# Patient Record
Sex: Female | Born: 1950 | Race: White | Hispanic: No | Smoking: Never smoker
Health system: Southern US, Community
[De-identification: ages and names within clinical notes are randomized; demographics above are authoritative.]

## PROBLEM LIST (undated history)

## (undated) DIAGNOSIS — R32 Unspecified urinary incontinence: Secondary | ICD-10-CM

## (undated) DIAGNOSIS — C801 Malignant (primary) neoplasm, unspecified: Secondary | ICD-10-CM

## (undated) DIAGNOSIS — J309 Allergic rhinitis, unspecified: Secondary | ICD-10-CM

## (undated) DIAGNOSIS — B019 Varicella without complication: Secondary | ICD-10-CM

## (undated) DIAGNOSIS — M1990 Unspecified osteoarthritis, unspecified site: Secondary | ICD-10-CM

## (undated) DIAGNOSIS — E039 Hypothyroidism, unspecified: Secondary | ICD-10-CM

## (undated) DIAGNOSIS — A630 Anogenital (venereal) warts: Secondary | ICD-10-CM

## (undated) HISTORY — PX: LAPAROSCOPY: SHX197

## (undated) HISTORY — DX: Unspecified osteoarthritis, unspecified site: M19.90

## (undated) HISTORY — PX: APPENDECTOMY: SHX54

## (undated) HISTORY — DX: Unspecified urinary incontinence: R32

## (undated) HISTORY — PX: WISDOM TOOTH EXTRACTION: SHX21

## (undated) HISTORY — DX: Allergic rhinitis, unspecified: J30.9

## (undated) HISTORY — PX: TONSILLECTOMY: SUR1361

## (undated) HISTORY — DX: Anogenital (venereal) warts: A63.0

## (undated) HISTORY — PX: BASAL CELL CARCINOMA EXCISION: SHX1214

## (undated) HISTORY — DX: Varicella without complication: B01.9

## (undated) HISTORY — PX: DENTAL SURGERY: SHX609

## (undated) HISTORY — DX: Hypothyroidism, unspecified: E03.9

---

## 2015-10-13 ENCOUNTER — Ambulatory Visit (INDEPENDENT_AMBULATORY_CARE_PROVIDER_SITE_OTHER): Payer: No Typology Code available for payment source | Admitting: Family Medicine

## 2015-10-13 ENCOUNTER — Encounter: Payer: Self-pay | Admitting: Family Medicine

## 2015-10-13 VITALS — BP 112/68 | HR 86 | Temp 98.1°F | Ht 66.5 in | Wt 193.6 lb

## 2015-10-13 DIAGNOSIS — R5383 Other fatigue: Secondary | ICD-10-CM | POA: Diagnosis not present

## 2015-10-13 LAB — CBC WITH DIFFERENTIAL/PLATELET
BASOS PCT: 0 %
Basophils Absolute: 0 cells/uL (ref 0–200)
EOS ABS: 136 {cells}/uL (ref 15–500)
EOS PCT: 2 %
HCT: 43.4 % (ref 35.0–45.0)
Hemoglobin: 14.4 g/dL (ref 11.7–15.5)
LYMPHS PCT: 29 %
Lymphs Abs: 1972 cells/uL (ref 850–3900)
MCH: 29.4 pg (ref 27.0–33.0)
MCHC: 33.2 g/dL (ref 32.0–36.0)
MCV: 88.8 fL (ref 80.0–100.0)
MONOS PCT: 9 %
MPV: 9.6 fL (ref 7.5–12.5)
Monocytes Absolute: 612 cells/uL (ref 200–950)
NEUTROS ABS: 4080 {cells}/uL (ref 1500–7800)
Neutrophils Relative %: 60 %
PLATELETS: 345 10*3/uL (ref 140–400)
RBC: 4.89 MIL/uL (ref 3.80–5.10)
RDW: 13.6 % (ref 11.0–15.0)
WBC: 6.8 10*3/uL (ref 3.8–10.8)

## 2015-10-13 LAB — CORTISOL: CORTISOL PLASMA: 9.7 ug/dL

## 2015-10-13 LAB — TSH: TSH: 0.09 mIU/L — ABNORMAL LOW

## 2015-10-13 NOTE — Assessment & Plan Note (Signed)
Patient reports symptoms of feeling crummy over the last week. Only specific complaints other than this are loose stools and some sinus pressure. Does note some sadness though no specific depression. She has a benign exam today. Vital signs are stable. We'll check lab work as outlined below to workup further. If lab work unremarkable with suspect viral illness leading to loose stools and fatigue. She'll continue to monitor through the weekend and next week if not improving will let us know. Given return precautions.

## 2015-10-13 NOTE — Progress Notes (Signed)
Pre visit review using our clinic review tool, if applicable. No additional management support is needed unless otherwise documented below in the visit note. 

## 2015-10-13 NOTE — Progress Notes (Signed)
Patient ID: Ashley Andrews, female   DOB: 1950/04/25, 65 y.o.   MRN: [id number]  Tommi Rumps, MD Phone: [phone]  Ashley Andrews is a 65 y.o. female who presents today for new patient visit.  Patient notes over the last week she has felt crummy. Started on Monday. Has had some loose stools with this. Some coldness and clamminess. Some mild stomach cramping though no abdominal pain. No fevers. No urinary issues. No chest pain. No shortness of breath. No blood in her stool. No nausea or vomiting. She's been eating a bland diet. She does note some sinus pressure and congestion though this has improved with saline irrigation. She has a history of hypothyroidism. Currently taking nature thyroid. Additionally taking DHEA for adrenal insufficiency. She does note some sadness related to her late husband's death about 6 months ago. She notes continued intermittent sadness with this. Occasionally in the past she had felt as though she be better off not being around though no intent or plan to harm herself. Has not had these thoughts recently. He notes an occasional night sweat that she chalked up to using a heavy blanket during the summer. Has improved with switching to her summer bed cover.  Active Ambulatory Problems    Diagnosis Date Noted  . Other fatigue 10/13/2015   Resolved Ambulatory Problems    Diagnosis Date Noted  . No Resolved Ambulatory Problems   Past Medical History  Diagnosis Date  . Arthritis   . Chickenpox   . Genital warts   . Allergic rhinitis   . Urine incontinence   . Hypothyroidism     Family History  Problem Relation Age of Onset  . Lung cancer Maternal Grandfather   . Bone cancer Maternal Grandfather   . Breast cancer Father   . Colon cancer Paternal Uncle   . Skin cancer Other   . Heart attack Maternal Uncle   . Hypertension Mother   . Hyperlipidemia Mother   . Stroke Paternal Grandfather   . Transient ischemic attack Mother   . Transient ischemic attack Maternal  Aunt   . ADD / ADHD    . Arthritis    . Cataracts    . Arthritis/Rheumatoid Maternal Grandfather   . Dementia    . Diabetes    . Hiatal hernia    . Tuberculosis    . Migraines      Social History   Social History  . Marital Status: Widowed    Spouse Name: N/A  . Number of Children: N/A  . Years of Education: N/A   Occupational History  . Not on file.   Social History Main Topics  . Smoking status: Never Smoker   . Smokeless tobacco: Not on file  . Alcohol Use: 2.4 oz/week    4 Standard drinks or equivalent per week  . Drug Use: No  . Sexual Activity: Not on file   Other Topics Concern  . Not on file   Social History Narrative  . No narrative on file    ROS  General:  Negative for nexplained weight loss, fever Skin: Negative for new or changing mole, sore that won't heal HEENT: Negative for trouble hearing, trouble seeing, ringing in ears, mouth sores, hoarseness, change in voice, dysphagia. CV:  Negative for chest pain, dyspnea, edema, palpitations Resp: Negative for cough, dyspnea, hemoptysis GI: Negative for nausea, vomiting, diarrhea, constipation, abdominal pain, melena, hematochezia. GU: Positive for minor incontinence, Negative for dysuria, urinary hesitance, hematuria, vaginal or penile discharge, polyuria, sexual difficulty,  lumps in testicle or breasts MSK: Negative for muscle cramps or aches, joint pain or swelling Neuro: Negative for headaches, weakness, numbness, dizziness, passing out/fainting Psych: Positive for sadness, stress, Negative for anxiety, memory problems  Objective  Physical Exam Filed Vitals:   10/13/15 1524  BP: 112/68  Pulse: 86  Temp: 98.1 F (36.7 C)    BP Readings from Last 3 Encounters:  10/13/15 112/68   Wt Readings from Last 3 Encounters:  10/13/15 193 lb 9.6 oz (87.816 kg)    Physical Exam  Constitutional: She is well-developed, well-nourished, and in no distress.  HENT:  Head: Normocephalic and atraumatic.    Right Ear: External ear normal.  Left Ear: External ear normal.  Mouth/Throat: Oropharynx is clear and moist. No oropharyngeal exudate.  Initially bilateral ear canals impacted with cerumen, irrigated by CMA revealing normal TMs bilaterally  Eyes: Conjunctivae are normal. Pupils are equal, round, and reactive to light.  Neck: Neck supple.  Cardiovascular: Normal rate, regular rhythm and normal heart sounds.   Pulmonary/Chest: Effort normal and breath sounds normal.  Abdominal: Soft. Bowel sounds are normal. She exhibits no distension. There is no tenderness. There is no rebound and no guarding.  Musculoskeletal: She exhibits no edema.  Lymphadenopathy:    She has no cervical adenopathy.  Neurological: She is alert. Gait normal.  Skin: Skin is warm and dry. She is not diaphoretic.  Psychiatric: Mood and affect normal.     Assessment/Plan:   Other fatigue Patient reports symptoms of feeling crummy over the last week. Only specific complaints other than this are loose stools and some sinus pressure. Does note some sadness though no specific depression. She has a benign exam today. Vital signs are stable. We'll check lab work as outlined below to workup further. If lab work unremarkable with suspect viral illness leading to loose stools and fatigue. She'll continue to monitor through the weekend and next week if not improving will let us know. Given return precautions.    Orders Placed This Encounter  Procedures  . CBC w/Diff  . Comp Met (CMET)  . Cortisol  . Sed Rate (ESR)  . TSH    No orders of the defined types were placed in this encounter.     Tommi Rumps, MD [place]

## 2015-10-13 NOTE — Patient Instructions (Signed)
Nice to see you. We are going to check some lab work to ensure that there is nothing additional going on with your fatigue. This could be related to a viral illness given your loose stools. If you develop abdominal pain, blood in your stool, fevers, thoughts of harming herself or others, or any new or change in symptoms please seek medical attention.

## 2015-10-14 LAB — COMPREHENSIVE METABOLIC PANEL
ALK PHOS: 60 U/L (ref 33–130)
ALT: 16 U/L (ref 6–29)
AST: 17 U/L (ref 10–35)
Albumin: 4.2 g/dL (ref 3.6–5.1)
BUN: 14 mg/dL (ref 7–25)
CO2: 24 mmol/L (ref 20–31)
CREATININE: 0.66 mg/dL (ref 0.50–0.99)
Calcium: 9.4 mg/dL (ref 8.6–10.4)
Chloride: 101 mmol/L (ref 98–110)
GLUCOSE: 128 mg/dL — AB (ref 65–99)
Potassium: 4.5 mmol/L (ref 3.5–5.3)
SODIUM: 136 mmol/L (ref 135–146)
TOTAL PROTEIN: 6.7 g/dL (ref 6.1–8.1)
Total Bilirubin: 0.4 mg/dL (ref 0.2–1.2)

## 2015-10-14 LAB — SEDIMENTATION RATE: Sed Rate: 3 mm/hr (ref 0–30)

## 2015-10-17 ENCOUNTER — Telehealth: Payer: Self-pay | Admitting: Family Medicine

## 2015-10-17 ENCOUNTER — Other Ambulatory Visit: Payer: Self-pay | Admitting: Surgical

## 2015-10-17 NOTE — Telephone Encounter (Signed)
Pt called and stated the Osteopath doctor in [place] did not received the fax.   Call pt @ [phone]. Thank you!

## 2015-10-18 ENCOUNTER — Other Ambulatory Visit: Payer: Self-pay | Admitting: Surgical

## 2015-10-18 NOTE — Addendum Note (Signed)
Addended by: Durwin Glaze on: 10/18/2015 03:09 PM   Modules accepted: Medications

## 2015-10-18 NOTE — Telephone Encounter (Signed)
Spoke with patient and she gave me the fax number to office.

## 2015-10-20 ENCOUNTER — Telehealth: Payer: Self-pay | Admitting: Family Medicine

## 2015-10-20 NOTE — Telephone Encounter (Signed)
The patient was told to call back if she did not feel any better from her previous appointment with Dr. Caryl Bis on 6.23.17. She stated she is not feeling any better. Would like a call back from [facility].

## 2015-10-20 NOTE — Telephone Encounter (Signed)
Notified patient of message and she will call back next week if not better to make appointment

## 2015-10-20 NOTE — Telephone Encounter (Signed)
I would advise monitoring through the weekend if she has continued to feel improved and if not continuing to improve would suggest follow-up.

## 2015-10-20 NOTE — Telephone Encounter (Signed)
Spoke with patient and she stated that she is feeling a little better from the other day. She is eating a bigger verity of foods. She is still feeling fatigue not as bad as the other day, but still not better completely. I explained that it sometimes takes a while for the body to get a hundred percent after being sick. Does she need to come back in or continue to wait?

## 2015-11-06 ENCOUNTER — Telehealth: Payer: Self-pay | Admitting: *Deleted

## 2015-11-06 NOTE — Telephone Encounter (Signed)
We have not received this.

## 2015-11-06 NOTE — Telephone Encounter (Signed)
See below

## 2015-11-06 NOTE — Telephone Encounter (Signed)
FYI Patient stated that [facility] drew her A1c for another provider. She had the results faxed over to the office on today. Pt contact [phone]

## 2015-11-06 NOTE — Telephone Encounter (Signed)
Please advise if this was received in his mailbox, thanks

## 2015-11-08 NOTE — Telephone Encounter (Signed)
Notified patient that labs came in today

## 2015-11-08 NOTE — Telephone Encounter (Signed)
Patient called to see if you have received a fax with her A1C on it from Dr. Charm Rings phone # [phone].

## 2015-11-09 ENCOUNTER — Ambulatory Visit (INDEPENDENT_AMBULATORY_CARE_PROVIDER_SITE_OTHER): Payer: No Typology Code available for payment source | Admitting: Family Medicine

## 2015-11-09 ENCOUNTER — Encounter: Payer: Self-pay | Admitting: Family Medicine

## 2015-11-09 VITALS — BP 110/72 | HR 90 | Temp 98.5°F | Ht 66.5 in | Wt 194.0 lb

## 2015-11-09 DIAGNOSIS — R7303 Prediabetes: Secondary | ICD-10-CM

## 2015-11-09 DIAGNOSIS — R600 Localized edema: Secondary | ICD-10-CM | POA: Insufficient documentation

## 2015-11-09 DIAGNOSIS — R5383 Other fatigue: Secondary | ICD-10-CM | POA: Diagnosis not present

## 2015-11-09 DIAGNOSIS — E039 Hypothyroidism, unspecified: Secondary | ICD-10-CM

## 2015-11-09 NOTE — Patient Instructions (Signed)
Nice to see you. I am glad you're fatigue is better. Please continue exercise elevation of her legs to help with the swelling in her feet. Please watch your salt intake. Please discuss your thyroid with Dr. Johnathan Hausen.

## 2015-11-09 NOTE — Assessment & Plan Note (Signed)
Suspect venous insufficiency as cause versus related to her thyroid dysfunction. No CHF symptoms. Other recent lab work reassuring. Advised that she needs to follow-up with her doctor in [place] regarding her thyroid as she continues to follow with them for this issue. She sees him next week. She'll continue to exercise and elevate her legs. Could consider compression stockings in the future.

## 2015-11-09 NOTE — Assessment & Plan Note (Signed)
Resolved. Suspect viral illness. She'll continue to monitor for recurrence.

## 2015-11-09 NOTE — Assessment & Plan Note (Signed)
She will continue with diet and exercise.

## 2015-11-09 NOTE — Assessment & Plan Note (Signed)
TSH is low. Overtreated at this time. She is on Armour thyroid and a host of other supplements for this. She wants to continue following with her doctor in [place] regarding this. Advised that she needs to discuss this with them when she sees her next week.

## 2015-11-09 NOTE — Progress Notes (Signed)
Pre visit review using our clinic review tool, if applicable. No additional management support is needed unless otherwise documented below in the visit note. 

## 2015-11-09 NOTE — Progress Notes (Signed)
  Tommi Rumps, MD Phone: [phone]  Ashley Andrews is a 65 y.o. female who presents today for follow-up.  Fatigue: Patient notes this is resolved.  Bilateral ankle swelling: Patient notes has had this since moving to [place]. Notes it was bad last week while she was at the beach. Thinks this is related to mostly eating in the cafeteria where she lives. Left minimally greater than right swelling. Note she has started walking 30 minutes a day and it is improved. Also elevating her legs. No orthopnea or PND. Does have a history of hypothyroidism and recent TSH was 0.06. She [place] Dr. out of state for this who is a holistic M.D. and plans to see them next week to discuss her thyroid.  Prediabetes: A1c recently checked in 5.9. Just started exercise and has been watching her diet.  PMH: nonsmoker.   ROS see history of present illness  Objective  Physical Exam Filed Vitals:   11/09/15 0928  BP: 110/72  Pulse: 90  Temp: 98.5 F (36.9 C)    BP Readings from Last 3 Encounters:  11/09/15 110/72  10/13/15 112/68   Wt Readings from Last 3 Encounters:  11/09/15 194 lb (87.998 kg)  10/13/15 193 lb 9.6 oz (87.816 kg)    Physical Exam  Constitutional: She is well-developed, well-nourished, and in no distress.  HENT:  Head: Normocephalic and atraumatic.  Cardiovascular: Normal rate, regular rhythm and normal heart sounds.   Trace edema bilaterally  Pulmonary/Chest: Effort normal and breath sounds normal.  Neurological: She is alert. Gait normal.  Skin: Skin is warm and dry.     Assessment/Plan: Please see individual problem list.  Other fatigue Resolved. Suspect viral illness. She'll continue to monitor for recurrence.  Bilateral lower extremity edema Suspect venous insufficiency as cause versus related to her thyroid dysfunction. No CHF symptoms. Other recent lab work reassuring. Advised that she needs to follow-up with her doctor in [place] regarding her  thyroid as she continues to follow with them for this issue. She sees him next week. She'll continue to exercise and elevate her legs. Could consider compression stockings in the future.  Prediabetes She will continue with diet and exercise.  Hypothyroidism TSH is low. Overtreated at this time. She is on Armour thyroid and a host of other supplements for this. She wants to continue following with her doctor in [place] regarding this. Advised that she needs to discuss this with them when she sees her next week.    No orders of the defined types were placed in this encounter.    No orders of the defined types were placed in this encounter.    Tommi Rumps, MD [place]

## 2015-11-28 ENCOUNTER — Encounter: Payer: Self-pay | Admitting: Family Medicine

## 2016-02-09 ENCOUNTER — Telehealth: Payer: Self-pay | Admitting: *Deleted

## 2016-02-09 DIAGNOSIS — Z13820 Encounter for screening for osteoporosis: Secondary | ICD-10-CM

## 2016-02-09 DIAGNOSIS — Z1239 Encounter for other screening for malignant neoplasm of breast: Secondary | ICD-10-CM

## 2016-02-09 DIAGNOSIS — Z124 Encounter for screening for malignant neoplasm of cervix: Secondary | ICD-10-CM

## 2016-02-09 DIAGNOSIS — Z85828 Personal history of other malignant neoplasm of skin: Secondary | ICD-10-CM

## 2016-02-09 DIAGNOSIS — Z1211 Encounter for screening for malignant neoplasm of colon: Secondary | ICD-10-CM

## 2016-02-09 NOTE — Telephone Encounter (Signed)
Spoke with patient states she would like to be referred to OB/Gyn for annual pap smear.  Also needs referral for Mammogram /DEXA .    Patient states she needs referral for dermatology for history of basal cell carcinoma .  She is new to area not familiar with local doctors  previously lived in [place].   Also needs referral for colonoscopy last done 11/30/2008.  Patient is scheduled for physical in 12/17.

## 2016-02-09 NOTE — Telephone Encounter (Signed)
Orders placed.

## 2016-02-09 NOTE — Telephone Encounter (Signed)
Patient requested to have orders place to see a OB GYN(HRT) and dermatologist. Pt contact [phone]

## 2016-02-12 NOTE — Telephone Encounter (Signed)
Patient advised she should be contacting in regards to referrals.

## 2016-02-26 ENCOUNTER — Encounter: Payer: Self-pay | Admitting: Family Medicine

## 2016-03-25 ENCOUNTER — Encounter: Payer: Self-pay | Admitting: Family Medicine

## 2016-03-25 ENCOUNTER — Ambulatory Visit (INDEPENDENT_AMBULATORY_CARE_PROVIDER_SITE_OTHER): Payer: No Typology Code available for payment source | Admitting: Family Medicine

## 2016-03-25 ENCOUNTER — Telehealth: Payer: Self-pay | Admitting: Family Medicine

## 2016-03-25 VITALS — BP 106/66 | HR 98 | Temp 98.7°F | Wt 199.4 lb

## 2016-03-25 DIAGNOSIS — R5383 Other fatigue: Secondary | ICD-10-CM

## 2016-03-25 LAB — COMPREHENSIVE METABOLIC PANEL
ALT: 13 U/L (ref 0–35)
AST: 14 U/L (ref 0–37)
Albumin: 4.2 g/dL (ref 3.5–5.2)
Alkaline Phosphatase: 68 U/L (ref 39–117)
BILIRUBIN TOTAL: 0.6 mg/dL (ref 0.2–1.2)
BUN: 16 mg/dL (ref 6–23)
CALCIUM: 9.5 mg/dL (ref 8.4–10.5)
CO2: 25 meq/L (ref 19–32)
CREATININE: 0.55 mg/dL (ref 0.40–1.20)
Chloride: 101 mEq/L (ref 96–112)
GFR: 118.19 mL/min (ref >60.00)
GLUCOSE: 132 mg/dL — AB (ref 70–99)
Potassium: 4 mEq/L (ref 3.5–5.1)
SODIUM: 137 meq/L (ref 135–145)
Total Protein: 6.8 g/dL (ref 6.0–8.3)

## 2016-03-25 LAB — TSH: TSH: 0.08 u[IU]/mL — AB (ref 0.35–4.50)

## 2016-03-25 LAB — CORTISOL: CORTISOL PLASMA: 6.6 ug/dL

## 2016-03-25 LAB — CBC
HCT: 41.5 % (ref 36.0–46.0)
Hemoglobin: 14.1 g/dL (ref 12.0–15.0)
MCHC: 33.9 g/dL (ref 30.0–36.0)
MCV: 87.7 fl (ref 78.0–100.0)
Platelets: 287 10*3/uL (ref 150.0–400.0)
RBC: 4.73 Mil/uL (ref 3.87–5.11)
RDW: 13.3 % (ref 11.5–15.5)
WBC: 11.4 10*3/uL — ABNORMAL HIGH (ref 4.0–10.5)

## 2016-03-25 LAB — VITAMIN D 25 HYDROXY (VIT D DEFICIENCY, FRACTURES): VITD: 31.36 ng/mL (ref 30.00–100.00)

## 2016-03-25 LAB — VITAMIN B12

## 2016-03-25 NOTE — Patient Instructions (Signed)
Nice to see you. We will check lab work and call you with the results.  If you develop worsening or new symptoms please seek medical attention.

## 2016-03-25 NOTE — Telephone Encounter (Signed)
Spoke with patient and advised that she can take medications. She can also eat. If we need fasting labs we can schedule lab appointment for later date.

## 2016-03-25 NOTE — Telephone Encounter (Signed)
Pt called wanting to know if she's gonna have labs done today? Pt has a appt at 1:15pm And if she can eat and take her regular medication?  Call pt @ [phone]. Thank you!

## 2016-03-25 NOTE — Progress Notes (Signed)
Pre visit review using our clinic review tool, if applicable. No additional management support is needed unless otherwise documented below in the visit note. 

## 2016-03-26 NOTE — Assessment & Plan Note (Signed)
The patient has very vague symptoms. Has been improving over the last week or so. Could be related to grief or underlying thyroid dysfunction. No obvious cause on exam or history. We will obtain lab work as outlined below and then continue to monitor if no obvious cause on lab work. She wants to wait and get through the anniversary of her husband's death prior to working this up any further than lab work.

## 2016-03-26 NOTE — Progress Notes (Signed)
  Tommi Rumps, MD Phone: [phone]  Ashley Andrews is a 65 y.o. female who presents today for same-day visit.  Patient notes 1 week ago she was very tired and slept more than usual. She slept about 18-20 hours that day. Notes since then she has been more tired than usual though she is sleeping less and less progressively. Yesterday slept 1.5 hours extra compared to usual. She notes no other symptoms with this. No fevers, upper respiratory symptoms, body aches, chest pain, shortness of breath, sick contacts, apneic episodes, depression, anxiety, sweats, or chills she does note she is coming up on the anniversary of her husband's death and wonders if it may be some sort of grief reaction. She appears to have had cortisol issues in the past as well. Currently taking thyroid medication. She is taking in good food and liquids. No sick contacts to her knowledge. Notes it is not exertional fatigue or fatigue, though is a tiredness and a desire to sleep.  ROS see history of present illness  Objective  Physical Exam Vitals:   03/25/16 1317  BP: 106/66  Pulse: 98  Temp: 98.7 F (37.1 C)    BP Readings from Last 3 Encounters:  03/25/16 106/66  11/09/15 110/72  10/13/15 112/68   Wt Readings from Last 3 Encounters:  03/25/16 199 lb 6.4 oz (90.4 kg)  11/09/15 194 lb (88 kg)  10/13/15 193 lb 9.6 oz (87.8 kg)    Physical Exam  Constitutional: She is well-developed, well-nourished, and in no distress.  HENT:  Head: Normocephalic and atraumatic.  Mouth/Throat: Oropharynx is clear and moist. No oropharyngeal exudate.  Normal TMs bilaterally  Eyes: Conjunctivae are normal. Pupils are equal, round, and reactive to light.  Cardiovascular: Normal rate, regular rhythm and normal heart sounds.   Pulmonary/Chest: Effort normal and breath sounds normal.  Abdominal: Soft. Bowel sounds are normal. She exhibits no distension. There is no tenderness.  Musculoskeletal: She exhibits no edema.    Neurological: She is alert. Gait normal.  Skin: Skin is warm and dry.  Psychiatric: Mood and affect normal.     Assessment/Plan: Please see individual problem list.  Tiredness The patient has very vague symptoms. Has been improving over the last week or so. Could be related to grief or underlying thyroid dysfunction. No obvious cause on exam or history. We will obtain lab work as outlined below and then continue to monitor if no obvious cause on lab work. She wants to wait and get through the anniversary of her husband's death prior to working this up any further than lab work.   Orders Placed This Encounter  Procedures  . Comp Met (CMET)  . CBC  . TSH  . B12  . Vitamin D (25 hydroxy)  . Cortisol    Tommi Rumps, MD [place]

## 2016-03-28 ENCOUNTER — Ambulatory Visit: Payer: No Typology Code available for payment source

## 2016-04-01 ENCOUNTER — Encounter: Payer: No Typology Code available for payment source | Admitting: Family Medicine

## 2016-04-05 ENCOUNTER — Encounter: Payer: No Typology Code available for payment source | Admitting: Family Medicine

## 2016-04-08 ENCOUNTER — Encounter: Payer: No Typology Code available for payment source | Admitting: Family Medicine

## 2016-04-09 LAB — HM DIABETES EYE EXAM

## 2016-04-10 ENCOUNTER — Encounter: Payer: Self-pay | Admitting: Family Medicine

## 2016-04-25 ENCOUNTER — Ambulatory Visit
Admission: RE | Admit: 2016-04-25 | Discharge: 2016-04-25 | Disposition: A | Discharge status: Home / Self Care | Payer: No Typology Code available for payment source | Source: Ambulatory Visit | Attending: Family Medicine | Admitting: Family Medicine

## 2016-04-25 DIAGNOSIS — Z1239 Encounter for other screening for malignant neoplasm of breast: Secondary | ICD-10-CM

## 2016-04-25 DIAGNOSIS — Z13820 Encounter for screening for osteoporosis: Secondary | ICD-10-CM

## 2016-04-25 DIAGNOSIS — Z1231 Encounter for screening mammogram for malignant neoplasm of breast: Secondary | ICD-10-CM | POA: Diagnosis present

## 2016-04-25 HISTORY — DX: Malignant (primary) neoplasm, unspecified: C80.1

## 2016-05-03 ENCOUNTER — Inpatient Hospital Stay
Admission: RE | Admit: 2016-05-03 | Discharge: 2016-05-03 | Disposition: A | Discharge status: Home / Self Care | Payer: Self-pay | Source: Ambulatory Visit | Attending: *Deleted | Admitting: *Deleted

## 2016-05-03 ENCOUNTER — Other Ambulatory Visit: Payer: Self-pay | Admitting: *Deleted

## 2016-05-03 DIAGNOSIS — Z9289 Personal history of other medical treatment: Secondary | ICD-10-CM

## 2016-05-10 ENCOUNTER — Telehealth: Payer: Self-pay | Admitting: *Deleted

## 2016-05-10 ENCOUNTER — Ambulatory Visit (INDEPENDENT_AMBULATORY_CARE_PROVIDER_SITE_OTHER): Payer: No Typology Code available for payment source | Admitting: Family Medicine

## 2016-05-10 ENCOUNTER — Encounter: Payer: Self-pay | Admitting: Family Medicine

## 2016-05-10 VITALS — BP 112/80 | HR 96 | Temp 98.2°F | Ht 66.5 in | Wt 203.0 lb

## 2016-05-10 DIAGNOSIS — R002 Palpitations: Secondary | ICD-10-CM | POA: Insufficient documentation

## 2016-05-10 DIAGNOSIS — R7309 Other abnormal glucose: Secondary | ICD-10-CM | POA: Diagnosis not present

## 2016-05-10 DIAGNOSIS — N3941 Urge incontinence: Secondary | ICD-10-CM | POA: Diagnosis not present

## 2016-05-10 DIAGNOSIS — R3915 Urgency of urination: Secondary | ICD-10-CM | POA: Diagnosis not present

## 2016-05-10 DIAGNOSIS — Z0001 Encounter for general adult medical examination with abnormal findings: Secondary | ICD-10-CM | POA: Diagnosis not present

## 2016-05-10 DIAGNOSIS — D72829 Elevated white blood cell count, unspecified: Secondary | ICD-10-CM | POA: Insufficient documentation

## 2016-05-10 LAB — POCT URINALYSIS DIPSTICK
Bilirubin, UA: NEGATIVE
Blood, UA: NEGATIVE
Glucose, UA: NEGATIVE
Ketones, UA: NEGATIVE
NITRITE UA: NEGATIVE
PH UA: 6.5
PROTEIN UA: NEGATIVE
Spec Grav, UA: 1.01
UROBILINOGEN UA: 0.2

## 2016-05-10 NOTE — Assessment & Plan Note (Signed)
Physical exam was completed today. Patient deferred pelvic exam and breast exam to gynecology. Health maintenance brought up-to-date. Advised on diet and exercise. Lab work as outlined below. Did have recent lab work in December as well.

## 2016-05-10 NOTE — Telephone Encounter (Signed)
Pt will bring in her resulted labs for A1c and CBC that she has drawn elsewhere, she will only need orders for a lipid panel. Patient requested to have her mammogram results sent over to [facility]

## 2016-05-10 NOTE — Progress Notes (Signed)
Tommi Rumps, MD Phone: [phone]  Ashley Andrews is a 66 y.o. female who presents today for physical exam.  Patient got away from exercising in December. She is starting to get back and riding a bike. States her diet could be better. Does get some protein in morning with breakfast. Tries to eat vegetables. Does eat a fair amount of dessert. Tetanus vaccination, flu vaccination, and Zostavax up-to-date. Hepatitis C and HIV screening up-to-date. She is going to see gynecology for Pap smear and breast exam. Mammogram up-to-date. Referral is already placed for colonoscopy. No tobacco use. Occasional alcohol use. No illicit drug use.  Palpitations: Patient notes these occur once in a while. They're much less frequent now as she has been lowering her thyroid dose through her physician in [place]. Palpitations typically lasts very briefly now. They used to last longer. Last EKG was 2-3 years ago and she reports was normal. She notes no chest pain or shortness of breath with palpitations. She does note some sadness about her husband dying over a year ago though no depression. No SI or HI.  Urgent incontinence: Patient notes she gets an urgency and just can't quite make it to the bathroom. No stress incontinence. No burning with urination. Occasionally wears a pad for this.   Active Ambulatory Problems    Diagnosis Date Noted  . Tiredness 10/13/2015  . Bilateral lower extremity edema 11/09/2015  . Prediabetes 11/09/2015  . Hypothyroidism 11/09/2015  . Encounter for general adult medical examination with abnormal findings 05/10/2016  . Palpitations 05/10/2016  . Urge incontinence 05/10/2016  . Urinary urgency 05/10/2016  . Leukocytosis 05/10/2016   Resolved Ambulatory Problems    Diagnosis Date Noted  . No Resolved Ambulatory Problems   Past Medical History:  Diagnosis Date  . Allergic rhinitis   . Arthritis   . Cancer ([place])   . Chickenpox   . Genital warts   . Hypothyroidism     . Urine incontinence     Family History  Problem Relation Age of Onset  . Lung cancer Maternal Grandfather   . Bone cancer Maternal Grandfather   . Arthritis/Rheumatoid Maternal Grandfather   . Breast cancer Father   . Colon cancer Paternal Uncle   . Skin cancer Other   . Heart attack Maternal Uncle   . Hypertension Mother   . Hyperlipidemia Mother   . Transient ischemic attack Mother   . Stroke Paternal Grandfather   . Transient ischemic attack Maternal Aunt   . ADD / ADHD    . Arthritis    . Cataracts    . Dementia    . Diabetes    . Hiatal hernia    . Tuberculosis    . Migraines      Social History   Social History  . Marital status: Widowed    Spouse name: N/A  . Number of children: N/A  . Years of education: N/A   Occupational History  . Not on file.   Social History Main Topics  . Smoking status: Never Smoker  . Smokeless tobacco: Never Used  . Alcohol use 2.4 oz/week    4 Standard drinks or equivalent per week  . Drug use: No  . Sexual activity: Not on file   Other Topics Concern  . Not on file   Social History Narrative  . No narrative on file    ROS  General:  Negative for nexplained weight loss, fever Skin: Negative for new or changing mole, sore that won't heal HEENT:  Positive for ringing in ears, Negative for trouble hearing, trouble seeing, mouth sores, hoarseness, change in voice, dysphagia. CV:  Negative for chest pain, dyspnea, edema, positive for palpitations Resp: Negative for cough, dyspnea, hemoptysis GI: Negative for nausea, vomiting, diarrhea, constipation, abdominal pain, melena, hematochezia. GU: Positive for urge incontinence, Negative for dysuria, urinary hesitance, hematuria, vaginal or penile discharge, polyuria, sexual difficulty, lumps in testicle or breasts MSK: Negative for muscle cramps or aches, joint pain or swelling Neuro: Negative for headaches, weakness, numbness, dizziness, passing out/fainting Psych: Positive  for sadness, Negative for depression, anxiety, memory problems  Objective  Physical Exam Vitals:   05/10/16 1431  BP: 112/80  Pulse: 96  Temp: 98.2 F (36.8 C)    BP Readings from Last 3 Encounters:  05/10/16 112/80  03/25/16 106/66  11/09/15 110/72   Wt Readings from Last 3 Encounters:  05/10/16 203 lb (92.1 kg)  03/25/16 199 lb 6.4 oz (90.4 kg)  11/09/15 194 lb (88 kg)    Physical Exam  Constitutional: She is well-developed, well-nourished, and in no distress.  HENT:  Head: Normocephalic and atraumatic.  Mouth/Throat: Oropharynx is clear and moist.  Eyes: Conjunctivae are normal. Pupils are equal, round, and reactive to light.  Cardiovascular: Normal rate, regular rhythm and normal heart sounds.   Pulmonary/Chest: Effort normal and breath sounds normal.  Abdominal: Soft. Bowel sounds are normal. She exhibits no distension. There is no tenderness.  Musculoskeletal: She exhibits no edema.  Neurological: She is alert. Gait normal.  Skin: Skin is warm and dry.  Psychiatric: Mood and affect normal.   EKG: Normal sinus rhythm, rate 91, RSR prime in V1, no ST or T-wave changes  Assessment/Plan:   Encounter for general adult medical examination with abnormal findings Physical exam was completed today. Patient deferred pelvic exam and breast exam to gynecology. Health maintenance brought up-to-date. Advised on diet and exercise. Lab work as outlined below. Did have recent lab work in December as well.  Palpitations Suspect related to over treatment with thyroid medication given prior low TSH. These have significantly improved as her physician in [place] has slowly decreased her thyroid dose. EKG done today is reassuring. Discussed continuing to monitor and if not continuing to improve with decreasing dose of thyroid medication could consider further workup.  Urge incontinence Urinary urge incontinence. Patient notes this is not too much of an issue for her. We will defer  medication at this time. Discussed timed voiding though it does not bother her enough to do this. She'll continue to monitor. We'll check a UA today.  Leukocytosis Noted on prior lab work. Mild elevation. Discussed nonspecific. Plan to recheck. Patient is supposed to have fasting lab work done though she thinks she had a lipid panel done late last year. She will check on this and let us know. She will return for labs either way and will let us know so she can schedule an appointment for this.  Patient sadness is related to loss of her husband last year. No depression. Discussed continuing to monitor this.  Orders Placed This Encounter  Procedures  . CBC    Standing Status:   Future    Standing Expiration Date:   05/10/2017  . HgB A1c    Standing Status:   Future    Standing Expiration Date:   05/10/2017  . Lipid Profile    Standing Status:   Future    Standing Expiration Date:   05/10/2017  . POCT Urinalysis Dipstick  . EKG 12-Lead  Tommi Rumps, MD [place]

## 2016-05-10 NOTE — Telephone Encounter (Signed)
FYI on labs , [facility] will see this with epic

## 2016-05-10 NOTE — Addendum Note (Signed)
Addended by: Leeanne Rio on: 05/10/2016 04:14 PM   Modules accepted: Orders

## 2016-05-10 NOTE — Patient Instructions (Addendum)
Nice to see you. We'll get some fasting labs at your convenience and contact you with the results. Please work on diet and exercise. If you develop persistent palpitations please seek medical attention immediately.

## 2016-05-10 NOTE — Assessment & Plan Note (Signed)
Suspect related to over treatment with thyroid medication given prior low TSH. These have significantly improved as her physician in [place] has slowly decreased her thyroid dose. EKG done today is reassuring. Discussed continuing to monitor and if not continuing to improve with decreasing dose of thyroid medication could consider further workup.

## 2016-05-10 NOTE — Assessment & Plan Note (Signed)
Noted on prior lab work. Mild elevation. Discussed nonspecific. Plan to recheck. Patient is supposed to have fasting lab work done though she thinks she had a lipid panel done late last year. She will check on this and let us know. She will return for labs either way and will let us know so she can schedule an appointment for this.

## 2016-05-10 NOTE — Assessment & Plan Note (Signed)
Urinary urge incontinence. Patient notes this is not too much of an issue for her. We will defer medication at this time. Discussed timed voiding though it does not bother her enough to do this. She'll continue to monitor. We'll check a UA today.

## 2016-05-10 NOTE — Progress Notes (Signed)
Pre visit review using our clinic review tool, if applicable. No additional management support is needed unless otherwise documented below in the visit note. 

## 2016-05-11 LAB — URINALYSIS, MICROSCOPIC ONLY
BACTERIA UA: NONE SEEN [HPF]
Casts: NONE SEEN [LPF]
Crystals: NONE SEEN [HPF]
RBC / HPF: NONE SEEN RBC/HPF (ref <=2)
SQUAMOUS EPITHELIAL / LPF: NONE SEEN [HPF] (ref <=5)
WBC, UA: NONE SEEN WBC/HPF (ref <=5)
Yeast: NONE SEEN [HPF]

## 2016-05-12 LAB — URINE CULTURE: ORGANISM ID, BACTERIA: NO GROWTH

## 2016-05-13 ENCOUNTER — Ambulatory Visit: Payer: No Typology Code available for payment source | Admitting: Family Medicine

## 2016-05-13 NOTE — Telephone Encounter (Signed)
Patient notified and lab scheduled.

## 2016-05-13 NOTE — Telephone Encounter (Signed)
Left message to return call 

## 2016-05-13 NOTE — Telephone Encounter (Signed)
Noted. Please see if these are labs she had after our visit in December. If they are not we will still need to obtain the CBC as that is to recheck a slightly low white blood cell count she had at that time. Lipid panel already ordered. A1c will be canceled. Thanks.

## 2016-05-13 NOTE — Addendum Note (Signed)
Addended by: Leone Haven on: 05/13/2016 08:09 AM   Modules accepted: Orders

## 2016-05-16 ENCOUNTER — Other Ambulatory Visit (INDEPENDENT_AMBULATORY_CARE_PROVIDER_SITE_OTHER): Payer: No Typology Code available for payment source

## 2016-05-16 DIAGNOSIS — Z0001 Encounter for general adult medical examination with abnormal findings: Secondary | ICD-10-CM | POA: Diagnosis not present

## 2016-05-16 DIAGNOSIS — R002 Palpitations: Secondary | ICD-10-CM | POA: Diagnosis not present

## 2016-05-16 LAB — CBC
HEMATOCRIT: 41.7 % (ref 36.0–46.0)
Hemoglobin: 14.1 g/dL (ref 12.0–15.0)
MCHC: 33.7 g/dL (ref 30.0–36.0)
MCV: 87.8 fl (ref 78.0–100.0)
PLATELETS: 331 10*3/uL (ref 150.0–400.0)
RBC: 4.76 Mil/uL (ref 3.87–5.11)
RDW: 14.3 % (ref 11.5–15.5)
WBC: 5.6 10*3/uL (ref 4.0–10.5)

## 2016-05-16 LAB — LIPID PANEL
CHOL/HDL RATIO: 3
Cholesterol: 170 mg/dL (ref 0–200)
HDL: 63.2 mg/dL (ref >39.00)
LDL CALC: 91 mg/dL (ref 0–99)
NONHDL: 106.77
TRIGLYCERIDES: 78 mg/dL (ref 0.0–149.0)
VLDL: 15.6 mg/dL (ref 0.0–40.0)

## 2016-05-21 ENCOUNTER — Encounter: Payer: Self-pay | Admitting: Obstetrics and Gynecology

## 2016-05-21 ENCOUNTER — Ambulatory Visit (INDEPENDENT_AMBULATORY_CARE_PROVIDER_SITE_OTHER): Payer: No Typology Code available for payment source | Admitting: Obstetrics and Gynecology

## 2016-05-21 VITALS — BP 111/74 | HR 90 | Ht 66.5 in | Wt 200.2 lb

## 2016-05-21 DIAGNOSIS — E039 Hypothyroidism, unspecified: Secondary | ICD-10-CM

## 2016-05-21 DIAGNOSIS — E669 Obesity, unspecified: Secondary | ICD-10-CM

## 2016-05-21 DIAGNOSIS — Z01419 Encounter for gynecological examination (general) (routine) without abnormal findings: Secondary | ICD-10-CM | POA: Diagnosis not present

## 2016-05-21 DIAGNOSIS — N952 Postmenopausal atrophic vaginitis: Secondary | ICD-10-CM | POA: Diagnosis not present

## 2016-05-21 DIAGNOSIS — R3915 Urgency of urination: Secondary | ICD-10-CM

## 2016-05-21 DIAGNOSIS — N3941 Urge incontinence: Secondary | ICD-10-CM

## 2016-05-21 DIAGNOSIS — Z79890 Hormone replacement therapy (postmenopausal): Secondary | ICD-10-CM

## 2016-05-21 DIAGNOSIS — R7303 Prediabetes: Secondary | ICD-10-CM

## 2016-05-21 MED ORDER — ESTRADIOL 10 MCG VA TABS: 1.0000 | ORAL_TABLET | VAGINAL | 12 refills | Status: DC

## 2016-05-21 NOTE — Progress Notes (Signed)
ANNUAL PREVENTATIVE CARE GYNECOLOGY  ENCOUNTER NOTE  Subjective:       Ashley Andrews is a 66 y.o. G0P0 female here to establish care and for a routine annual gynecologic exam. Relocated last year from [place]. The patient is not sexually active. The patient is taking hormone replacement therapy (Climara, and Estrace cream). Patient denies post-menopausal vaginal bleeding. The patient wears seatbelts: yes. The patient participates in regular exercise: yes. The patient reports that there is not domestic violence in her life.  Current complaints: 1.  None.  Patient does question if she still needs to continue taking her Estrace cream. Has not used it much over the past 2 years and denies vaginal complaints or discomfort.  Notes that it is messy and does not like to use it.    Gynecologic History Patient's last menstrual period was 11/21/2003. L and elected weekend for the first for were G times rigidus overall he is given instructions well with her infant Contraception: post menopausal status Last Pap: 06/2014. Results were: normal Last mammogram:04/2016 Results were: BIRADS 2: benign findings.  Fibroglandular densities of breasts with no malignant findings.    Last Colonoscopy: 11/2008.  Results were: normal.  Last Dexa Scan: 04/25/2016. Normal.   Obstetric History OB History  Gravida Para Term Preterm AB Living  0 0 0 0 0 0  SAB TAB Ectopic Multiple Live Births  0 0 0 0 0        Past Medical History:  Diagnosis Date  . Allergic rhinitis   . Arthritis   . Cancer ([facility])    basil cell  . Chickenpox   . Genital warts   . Hypothyroidism   . Urine incontinence     Family History  Problem Relation Age of Onset  . Lung cancer Maternal Grandfather   . Bone cancer Maternal Grandfather   . Arthritis/Rheumatoid Maternal Grandfather   . Breast cancer Father   . Colon cancer Paternal Uncle   . Skin cancer Other   . Heart attack Maternal Uncle   . Hypertension Mother   . Hyperlipidemia  Mother   . Transient ischemic attack Mother   . Stroke Paternal Grandfather   . Transient ischemic attack Maternal Aunt   . ADD / ADHD    . Arthritis    . Cataracts    . Dementia    . Diabetes    . Hiatal hernia    . Tuberculosis    . Migraines      Past Surgical History:  Procedure Laterality Date  . APPENDECTOMY    . BASAL CELL CARCINOMA EXCISION    . DENTAL SURGERY    . LAPAROSCOPY    . TONSILLECTOMY    . WISDOM TOOTH EXTRACTION      Social History   Social History  . Marital status: Widowed    Spouse name: N/A  . Number of children: N/A  . Years of education: N/A   Occupational History  . Not on file.   Social History Main Topics  . Smoking status: Never Smoker  . Smokeless tobacco: Never Used  . Alcohol use 2.4 oz/week    4 Standard drinks or equivalent per week  . Drug use: No  . Sexual activity: Not Currently    Birth control/ protection: None   Other Topics Concern  . Not on file   Social History Narrative  . No narrative on file    Current Outpatient Prescriptions on File Prior to Visit  Medication Sig Dispense Refill  .  Ascorbic Acid (VITAMIN C) 250 MG CHEW Chew by mouth.    . Ashwagandha 500 MG CAPS Take by mouth.    Marland Kitchen aspirin 81 MG tablet Take 81 mg by mouth daily.    . B Complex Vitamins (VITAMIN B-COMPLEX PO) Take by mouth.    . Black Cohosh 540 MG CAPS Take by mouth.    . Calcium Citrate (CITRACAL PO) Take by mouth.    . Chromium Picolinate (CHROMIUM PICOLATE PO) Take by mouth.    Marland Kitchen CINNAMON PO Take by mouth.    . Coenzyme Q10 (COQ10) 200 MG CAPS Take by mouth.    . cyclobenzaprine (FLEXERIL) 10 MG tablet Take 10 mg by mouth daily as needed for muscle spasms.    Marland Kitchen DHEA 10 MG CAPS Take by mouth.    . diclofenac sodium (VOLTAREN) 1 % GEL Apply topically 4 (four) times daily.    Marland Kitchen estradiol (CLIMARA - DOSED IN MG/24 HR) 0.0375 mg/24hr patch Place 0.0375 mg onto the skin once a week.    . estradiol (ESTRACE) 0.1 MG/GM vaginal cream Place 1  Applicatorful vaginally 3 (three) times a week.    . Flaxseed, Linseed, (FLAXSEED OIL PO) Take by mouth.    . fluticasone (FLONASE) 50 MCG/ACT nasal spray Place 2 sprays into both nostrils daily.  2  . Glucosamine Sulfate 1000 MG CAPS Take by mouth.    . Iodine, Kelp, (KELP PO) Take 600 mg by mouth.    . IRON, FERROUS SULFATE, PO Take by mouth.    . loratadine (CLARITIN) 10 MG tablet Take 10 mg by mouth daily.    Marland Kitchen MAGNESIUM GLUCONATE PO Take by mouth.    . Melatonin 10 MG CAPS Take by mouth.    . metFORMIN (GLUCOPHAGE-XR) 500 MG 24 hr tablet     . metroNIDAZOLE (METROCREAM) 0.75 % cream Apply topically 2 (two) times daily.    . Multiple Vitamin (MULTIVITAMIN) tablet Take 1 tablet by mouth daily.    Marland Kitchen NATURE-THROID 16.25 MG TABS     . Omega-3 Fatty Acids (FISH OIL) 1000 MG CAPS Take by mouth.    Marland Kitchen OVER THE COUNTER MEDICATION P5P    . Pregnenolone Micronized POWD by Does not apply route.    . Probiotic Product (PROBIOTIC-10 ULTIMATE PO) Take by mouth.    . progesterone (PROMETRIUM) 100 MG capsule     . thyroid (ARMOUR) 130 MG tablet Take 130 mg by mouth daily.    . Vitamin D, Ergocalciferol, (DRISDOL) 50000 units CAPS capsule Take 50,000 Units by mouth every 7 (seven) days.    . vitamin E (VITAMIN E) 400 UNIT capsule Take 400 Units by mouth daily.    Marland Kitchen ZINC PICOLINATE PO Take by mouth.     No current facility-administered medications on file prior to visit.     Allergies  Allergen Reactions  . Pseudoephedrine Palpitations     Review of Systems ROS Review of Systems - General ROS: negative for - chills, fatigue, fever, hot flashes, night sweats, weight gain or weight loss Psychological ROS: negative for - anxiety, decreased libido, depression, mood swings, physical abuse or sexual abuse Ophthalmic ROS: negative for - blurry vision, eye pain or loss of vision ENT ROS: negative for - headaches, hearing change, visual changes or vocal changes Allergy and Immunology ROS: negative for  - hives, itchy/watery eyes or seasonal allergies Hematological and Lymphatic ROS: negative for - bleeding problems, bruising, swollen lymph nodes or weight loss Endocrine ROS: negative for - galactorrhea, hair  pattern changes, hot flashes, malaise/lethargy, mood swings, palpitations, polydipsia/polyuria, skin changes, temperature intolerance or unexpected weight changes Breast ROS: negative for - new or changing breast lumps or nipple discharge Respiratory ROS: negative for - cough or shortness of breath Cardiovascular ROS: negative for - chest pain, irregular heartbeat, palpitations or shortness of breath Gastrointestinal ROS: no abdominal pain, change in bowel habits, or black or bloody stools Genito-Urinary ROS: no dysuria, trouble voiding, or hematuria.  Positive for urinary urgency and incontinence (mild) Musculoskeletal ROS: negative for - joint pain or joint stiffness Neurological ROS: negative for - bowel and bladder control changes Dermatological ROS: negative for rash and skin lesion changes   Objective:   BP 111/74 (BP Location: Left Arm, Patient Position: Sitting, Cuff Size: Large)   Pulse 90   Ht 5' 6.5" (1.689 m)   Wt 200 lb 3.2 oz (90.8 kg)   LMP 11/21/2003   BMI 31.83 kg/m  CONSTITUTIONAL: Well-developed, well-nourished female in no acute distress. Obesity, mild.  PSYCHIATRIC: Normal mood and affect. Normal behavior. Normal judgment and thought content. [place]: Alert and oriented to person, place, and time. Normal muscle tone coordination. No cranial nerve deficit noted. HENT:  Normocephalic, atraumatic, External right and left ear normal. Oropharynx is clear and moist EYES: Conjunctivae and EOM are normal. Pupils are equal, round, and reactive to light. No scleral icterus.  NECK: Normal range of motion, supple, no masses.  Normal thyroid.  SKIN: Skin is warm and dry. No rash noted. Not diaphoretic. No erythema. No pallor. CARDIOVASCULAR: Normal heart rate noted,  regular rhythm, no murmur. RESPIRATORY: Clear to auscultation bilaterally. Effort and breath sounds normal, no problems with respiration noted. BREASTS: Symmetric in size. No masses, skin changes, nipple drainage, or lymphadenopathy. ABDOMEN: Soft, normal bowel sounds, no distention noted.  No tenderness, rebound or guarding.  BLADDER: Normal PELVIC:  Bladder no bladder distension noted  Urethra: normal appearing urethra with no masses, tenderness or lesions  Vulva: normal appearing vulva with no masses, tenderness or lesions  Vagina: normal appearing vagina with normal color and discharge, no lesions and atrophic  Cervix: normal appearing cervix without discharge or lesions and cervical stenosis present  Uterus: uterus is normal size, shape, consistency and nontender  Adnexa: normal adnexa in size, nontender and no masses  RV: External Exam NormaI, No Rectal Masses and Normal Sphincter tone  MUSCULOSKELETAL: Normal range of motion. No tenderness.  No cyanosis, clubbing, or edema.  2+ distal pulses. LYMPHATIC: No Axillary, Supraclavicular, or Inguinal Adenopathy.      Lab Results  Component Value Date   WBC 5.6 05/16/2016   HGB 14.1 05/16/2016   HCT 41.7 05/16/2016   MCV 87.8 05/16/2016   PLT 331.0 05/16/2016      Chemistry      Component Value Date/Time   NA 137 03/25/2016 1344   K 4.0 03/25/2016 1344   CL 101 03/25/2016 1344   CO2 25 03/25/2016 1344   BUN 16 03/25/2016 1344   CREATININE 0.55 03/25/2016 1344   CREATININE 0.66 10/13/2015 1620      Component Value Date/Time   CALCIUM 9.5 03/25/2016 1344   ALKPHOS 68 03/25/2016 1344   AST 14 03/25/2016 1344   ALT 13 03/25/2016 1344   BILITOT 0.6 03/25/2016 1344      Lab Results  Component Value Date   TSH 0.08 (L) 03/25/2016    Lab Results  Component Value Date   CHOL 170 05/16/2016   HDL 63.20 05/16/2016   LDLCALC 91 05/16/2016  TRIG 78.0 05/16/2016   CHOLHDL 3 05/16/2016     Assessment:   Annual  gynecologic examination 66 y.o. Postmenopausal, on HRT Vaginal atrophy Obesity 1 Hypothyroidism Pre-diabetes Urinary urgency with incontinence (mild)  Plan:  - Pap: Not needed, up to date.  Can opt out of pap smears at age 61 as patient has never had a h/o abnormal pap smears . - Mammogram: Up to date - Stool Guaiac Testing:  Not Indicated.  Has had colonoscopy within the past 10 years. - Postmenopausal on HRT. Patient has been on HRT x ~10 years.  Discussed risks vs benefits of continued HRT.  Advised on attempting to wean from HRT, or decreasing to lowest effective dose.  Can also use Estrace cream as needed for vaginal atrophy as she currently notes that it is not bothersome to her.  - Routine preventative health maintenance measures emphasized: Exercise/Diet/Weight control, Alcohol/Substance use risks, Stress Management and Safe Sex - Hypothyroidism and Pre-diabetes managed by PCP.  - Urinary urgency with incontinence, mild. Discussed management options with patient. Notes that symptoms are not very bothersome currently, declines treatment.   Return to [facility] for annual exam, and in 6 weeks to f/u menopausal symptoms after weaning.    Rubie Maid, MD  [facility]

## 2016-05-31 ENCOUNTER — Encounter: Payer: Self-pay | Admitting: Family Medicine

## 2016-07-09 ENCOUNTER — Ambulatory Visit (INDEPENDENT_AMBULATORY_CARE_PROVIDER_SITE_OTHER): Payer: No Typology Code available for payment source | Admitting: Obstetrics and Gynecology

## 2016-07-09 ENCOUNTER — Encounter: Payer: Self-pay | Admitting: Obstetrics and Gynecology

## 2016-07-09 VITALS — BP 123/79 | HR 96 | Ht 66.5 in | Wt 200.4 lb

## 2016-07-09 DIAGNOSIS — Z79890 Hormone replacement therapy (postmenopausal): Secondary | ICD-10-CM

## 2016-07-09 MED ORDER — PROGESTERONE MICRONIZED 100 MG PO CAPS: 100.0000 mg | ORAL_CAPSULE | Freq: Every day | ORAL | 3 refills | Status: DC

## 2016-07-09 MED ORDER — ESTRADIOL 0.0375 MG/24HR TD PTTW: 1.0000 | MEDICATED_PATCH | TRANSDERMAL | 3 refills | Status: DC

## 2016-07-09 NOTE — Progress Notes (Signed)
    GYNECOLOGY PROGRESS NOTE  Subjective:    Patient ID: Ashley Andrews, female    DOB: 02/26/1951, 66 y.o.   MRN: [id number]  HPI  Patient is a 66 y.o. G0P0000 female who presents for f/u of HRT therapy.  Patient has been on HRT therapy for ~ 10 years.  Was encouraged last visit to try weaning to a lower dose, or even weaning off medications and reassess symptoms.  Patient notes that after weaning down to 1 patch per week, she began noticing moderate hot flushes occurring several times per day.  Was also noting some mild mood changes (depression-like symptoms).  Notes that she began resuming her patches twice weekly to help her symptoms resolve. Continues to deny PMB.   The following portions of the patient's history were reviewed and updated as appropriate: allergies, current medications, past family history, past medical history, past social history, past surgical history and problem list.  Review of Systems Pertinent items noted in HPI and remainder of comprehensive ROS otherwise negative.   Objective:   Blood pressure 123/79, pulse 96, height 5' 6.5" (1.689 m), weight 200 lb 6.4 oz (90.9 kg), last menstrual period 11/21/2003. General appearance: alert and no distress Remainder of exam deferred.    Assessment:   Postmenopausal female on HRT  Plan:   Patient has a 10 year history of HRT exposure with study data showing increased risk of thrombo-embolic events such as myocardial infarction stroke and breast cancer after 4 or more years exposure to combination products with estrogen and progesterone. She understands the benefits as well as the risks discussed above regarding continued use of hormonal replacement therapy. She  is advised that she may wish to discontinue the HRT at any time,  Her personal risk factors have been reviewed carefully with her today.  Due to continued symptoms upon weaning dose, will continue at current dose and evaluate at least yearly.  Refill given on HRT meds.     A total of 15 minutes were spent face-to-face with the patient during this encounter and over half of that time dealt with counseling and coordination of care.   Rubie Maid, MD [facility]

## 2016-08-27 ENCOUNTER — Ambulatory Visit (INDEPENDENT_AMBULATORY_CARE_PROVIDER_SITE_OTHER): Payer: No Typology Code available for payment source | Admitting: Family Medicine

## 2016-08-27 ENCOUNTER — Encounter: Payer: Self-pay | Admitting: Family Medicine

## 2016-08-27 DIAGNOSIS — J019 Acute sinusitis, unspecified: Secondary | ICD-10-CM

## 2016-08-27 DIAGNOSIS — J329 Chronic sinusitis, unspecified: Secondary | ICD-10-CM | POA: Insufficient documentation

## 2016-08-27 MED ORDER — AMOXICILLIN-POT CLAVULANATE 875-125 MG PO TABS: 1.0000 | ORAL_TABLET | Freq: Two times a day (BID) | ORAL | 0 refills | Status: DC

## 2016-08-27 NOTE — Assessment & Plan Note (Signed)
New problem. Treating with Augmentin.

## 2016-08-27 NOTE — Progress Notes (Signed)
Pre visit review using our clinic review tool, if applicable. No additional management support is needed unless otherwise documented below in the visit note. 

## 2016-08-27 NOTE — Progress Notes (Signed)
   Subjective:  Patient ID: Ashley Andrews, female    DOB: March 19, 1951  Age: 66 y.o. MRN: [id number]  CC: ? Sinus infection  HPI:  66 year old female presents to the above concern.  Patient reports a 1.5 week history of nasal congestion, ear pressure, postnasal drip, malaise/fatigue, and cough. No fever. She's been using over-the-counter sinus rinse, Mucinex, Tylenol without any improvement. She has recently had some discolored/green nasal discharge. No known exacerbating factors. No other associated symptoms. No other comments this time.  Social Hx   Social History   Social History  . Marital status: Widowed    Spouse name: N/A  . Number of children: N/A  . Years of education: N/A   Social History Main Topics  . Smoking status: Never Smoker  . Smokeless tobacco: Never Used  . Alcohol use 2.4 oz/week    4 Standard drinks or equivalent per week  . Drug use: No  . Sexual activity: Not Currently    Birth control/ protection: None   Other Topics Concern  . None   Social History Narrative  . None    Review of Systems  Constitutional: Positive for fatigue. Negative for fever.  HENT: Positive for congestion, postnasal drip, sinus pain and sinus pressure.   Respiratory: Positive for cough.    Objective:  BP 130/82   Pulse 84   Temp 98.6 F (37 C) (Oral)   Wt 199 lb 2 oz (90.3 kg)   LMP 11/21/2003   SpO2 93%   BMI 31.66 kg/m   BP/Weight 08/27/2016 07/09/2016 3/00/7622  Systolic BP 633 354 562  Diastolic BP 82 79 74  Wt. (Lbs) 199.13 200.4 200.2  BMI 31.66 31.86 31.83   Physical Exam  Constitutional: She is oriented to person, place, and time. She appears well-developed. No distress.  HENT:  Head: Normocephalic and atraumatic.  Mouth/Throat: Oropharynx is clear and moist.  Normal TM's.  Neck: Neck supple.  Cardiovascular: Normal rate and regular rhythm.   Pulmonary/Chest: Effort normal and breath sounds normal.  Lymphadenopathy:    She has no cervical adenopathy.    Neurological: She is alert and oriented to person, place, and time.  Psychiatric: She has a normal mood and affect.  Vitals reviewed.   Lab Results  Component Value Date   WBC 5.6 05/16/2016   HGB 14.1 05/16/2016   HCT 41.7 05/16/2016   PLT 331.0 05/16/2016   GLUCOSE 132 (H) 03/25/2016   CHOL 170 05/16/2016   TRIG 78.0 05/16/2016   HDL 63.20 05/16/2016   LDLCALC 91 05/16/2016   ALT 13 03/25/2016   AST 14 03/25/2016   NA 137 03/25/2016   K 4.0 03/25/2016   CL 101 03/25/2016   CREATININE 0.55 03/25/2016   BUN 16 03/25/2016   CO2 25 03/25/2016   TSH 0.08 (L) 03/25/2016    Assessment & Plan:   Problem List Items Addressed This Visit    Sinusitis    New problem. Treating with Augmentin.      Relevant Medications   amoxicillin-clavulanate (AUGMENTIN) 875-125 MG tablet     Meds ordered this encounter  Medications  . amoxicillin-clavulanate (AUGMENTIN) 875-125 MG tablet    Sig: Take 1 tablet by mouth 2 (two) times daily.    Dispense:  20 tablet    Refill:  0   Follow-up: PRN  [place]

## 2016-08-27 NOTE — Patient Instructions (Signed)
Antibiotic as prescribed.  Feel better  Dr. Giorgio Chabot  

## 2016-09-05 ENCOUNTER — Telehealth: Payer: Self-pay | Admitting: Family Medicine

## 2016-09-05 NOTE — Telephone Encounter (Signed)
Reason for call: fatigue , cough Symptoms: fatigue, cough dry ,vocational throughout the day,  nasal drainage, green a little when rinses sinuses,  No fever , no chest congestion  feels like some better,  Duration 2 1/2 weeks  Medications:finishes amoxicillin, Mucinex, Elderberry cough, Claritin,  syrup,tylenol prn Last seen for this problem: 08/27/16 Seen by: Should she switch to from Claritin to [place]  Please advise

## 2016-09-05 NOTE — Telephone Encounter (Signed)
Pt was seen last week in office. She is still not feeling better. Would like to speak to someone. Please call her at [phone].

## 2016-09-06 NOTE — Telephone Encounter (Signed)
Patient advised of below and verbalized understanding.  

## 2016-09-06 NOTE — Telephone Encounter (Signed)
It appears she was treated for a sinus infection previously. She could try switching to Allegra to see if that is beneficial. It may take some time for all of her symptoms to improve completely though if her symptoms have not significantly improved I would recommend reevaluation in the office.

## 2016-11-07 ENCOUNTER — Ambulatory Visit: Payer: No Typology Code available for payment source | Admitting: Family Medicine

## 2016-11-13 ENCOUNTER — Ambulatory Visit: Payer: No Typology Code available for payment source | Admitting: Family Medicine

## 2016-11-29 ENCOUNTER — Encounter: Payer: Self-pay | Admitting: Family Medicine

## 2016-11-29 ENCOUNTER — Ambulatory Visit (INDEPENDENT_AMBULATORY_CARE_PROVIDER_SITE_OTHER): Payer: No Typology Code available for payment source | Admitting: Family Medicine

## 2016-11-29 DIAGNOSIS — E039 Hypothyroidism, unspecified: Secondary | ICD-10-CM | POA: Diagnosis not present

## 2016-11-29 DIAGNOSIS — R7303 Prediabetes: Secondary | ICD-10-CM | POA: Diagnosis not present

## 2016-11-29 DIAGNOSIS — M25561 Pain in right knee: Secondary | ICD-10-CM | POA: Diagnosis not present

## 2016-11-29 NOTE — Patient Instructions (Signed)
Nice to see you. Please work on getting your car seat into a better position. Please work on dietary changes and easing back in to exercise.

## 2016-11-29 NOTE — Assessment & Plan Note (Signed)
Recent A1c stable. She is currently on metformin through her osteopathic physician. She'll continue the same and diet and exercise

## 2016-11-29 NOTE — Progress Notes (Signed)
  Tommi Rumps, MD Phone: [phone]  Ashley Andrews is a 66 y.o. female who presents today for follow-up.  Prediabetes: Does note some chronic thirst and polyuria. Recently had lab work with an A1c of 5.8. Her electrolytes were normal. She discussed with her osteopathic physician who advised to monitor these things. She has been trying to work on a light ketogenic diet. She tried to ease her way back and exercise though then developed some right leg issues.  She saw her osteopathic physician and podiatrist for her right leg issues. The podiatrist said it was not her inserts. He felt it was related to her position in the car that she recently bought. She's been working on adjusting this and her symptoms have improved somewhat. Notes it was slight knee pain and pain just distal to the knee. Some days are better than others.  Hypothyroidism: Recent TSH 0.18. Her osteopathic physician decreased her Armour Thyroid to half a tablet on Sundays and Thursdays and a whole tablet the rest of the week.  PMH: nonsmoker.   ROS see history of present illness  Objective  Physical Exam Vitals:   11/29/16 1359  BP: 122/88  Pulse: 92  Temp: 98.4 F (36.9 C)  SpO2: 94%    BP Readings from Last 3 Encounters:  11/29/16 122/88  08/27/16 130/82  07/09/16 123/79   Wt Readings from Last 3 Encounters:  11/29/16 195 lb 3.2 oz (88.5 kg)  08/27/16 199 lb 2 oz (90.3 kg)  07/09/16 200 lb 6.4 oz (90.9 kg)    Physical Exam  Constitutional: No distress.  Cardiovascular: Normal rate, regular rhythm and normal heart sounds.   Pulmonary/Chest: Effort normal and breath sounds normal.  Musculoskeletal:  Bilateral knees with no warmth, swelling, or erythema, ligaments laxity, negative McMurray's, no tenderness distal anterior lower leg  Neurological: She is alert. Gait normal.  Skin: She is not diaphoretic.     Assessment/Plan: Please see individual problem list.  Hypothyroidism Management by her  osteopathic physician. Note reviewed. Continue her current medication regimen per them.  Prediabetes Recent A1c stable. She is currently on metformin through her osteopathic physician. She'll continue the same and diet and exercise  Right knee pain Possibly related to the position that she sits in her car. Has improved since altering this position. She'll continue to work on that. She'll ease back into exercise. She does have a benign exam today. If not improving we could consider physical therapy or a referral to sports medicine.   Tommi Rumps, MD [place]

## 2016-11-29 NOTE — Assessment & Plan Note (Signed)
Management by her osteopathic physician. Note reviewed. Continue her current medication regimen per them.

## 2016-11-29 NOTE — Assessment & Plan Note (Signed)
Possibly related to the position that she sits in her car. Has improved since altering this position. She'll continue to work on that. She'll ease back into exercise. She does have a benign exam today. If not improving we could consider physical therapy or a referral to sports medicine.

## 2017-05-13 ENCOUNTER — Ambulatory Visit (INDEPENDENT_AMBULATORY_CARE_PROVIDER_SITE_OTHER): Payer: Medicare Other | Admitting: Obstetrics and Gynecology

## 2017-05-13 ENCOUNTER — Encounter: Payer: Self-pay | Admitting: Obstetrics and Gynecology

## 2017-05-13 VITALS — BP 113/70 | HR 96 | Ht 66.5 in | Wt 199.9 lb

## 2017-05-13 DIAGNOSIS — E039 Hypothyroidism, unspecified: Secondary | ICD-10-CM

## 2017-05-13 DIAGNOSIS — Z01419 Encounter for gynecological examination (general) (routine) without abnormal findings: Secondary | ICD-10-CM | POA: Diagnosis not present

## 2017-05-13 DIAGNOSIS — Z1231 Encounter for screening mammogram for malignant neoplasm of breast: Secondary | ICD-10-CM

## 2017-05-13 DIAGNOSIS — R7303 Prediabetes: Secondary | ICD-10-CM

## 2017-05-13 DIAGNOSIS — Z79890 Hormone replacement therapy (postmenopausal): Secondary | ICD-10-CM | POA: Diagnosis not present

## 2017-05-13 DIAGNOSIS — Z124 Encounter for screening for malignant neoplasm of cervix: Secondary | ICD-10-CM | POA: Diagnosis not present

## 2017-05-13 DIAGNOSIS — Z1239 Encounter for other screening for malignant neoplasm of breast: Secondary | ICD-10-CM

## 2017-05-13 MED ORDER — PROGESTERONE MICRONIZED 100 MG PO CAPS: 100.0000 mg | ORAL_CAPSULE | Freq: Every day | ORAL | 3 refills | Status: AC

## 2017-05-13 MED ORDER — ESTRADIOL 0.0375 MG/24HR TD PTTW: 1.0000 | MEDICATED_PATCH | TRANSDERMAL | 6 refills | Status: AC

## 2017-05-13 NOTE — Progress Notes (Signed)
ANNUAL PREVENTATIVE CARE GYNECOLOGY  ENCOUNTER NOTE  Subjective:       Ashley Andrews is a 67 y.o. G0P0 female here to establish care and for a routine annual gynecologic exam.  The patient is not sexually active. The patient is taking hormone replacement therapy (Climara). Patient denies post-menopausal vaginal bleeding. The patient wears seatbelts: yes. The patient participates in regular exercise: yes. The patient reports that there is not domestic violence in her life.  She states that she is planning on relocating to [place], [place] within the next 1-2 weeks.   Current complaints: 1.  None.    Gynecologic History Patient's last menstrual period was 11/21/2003.  Contraception: post menopausal status Last Pap: 06/2014. Results were: normal Last mammogram:04/2016 Results were: BIRADS 2: benign findings.  Fibroglandular densities of breasts with no malignant findings.    Last Colonoscopy: 11/2008.  Results were: overall normal per patient but does think there may have been mention of a polyp. Thinks she is due for another one .  Last Dexa Scan: 04/25/2016. Normal.    Obstetric History OB History  Gravida Para Term Preterm AB Living  0 0 0 0 0 0  SAB TAB Ectopic Multiple Live Births  0 0 0 0 0        Past Medical History:  Diagnosis Date  . Allergic rhinitis   . Arthritis   . Cancer (HCC)    basil cell  . Chickenpox   . Genital warts   . Hypothyroidism   . Urine incontinence     Family History  Problem Relation Age of Onset  . Lung cancer Maternal Grandfather   . Bone cancer Maternal Grandfather   . Arthritis/Rheumatoid Maternal Grandfather   . Breast cancer Father   . Hiatal hernia Father   . Diabetes Father   . Dementia Father   . Colon cancer Paternal Uncle   . Skin cancer Other   . Heart attack Maternal Uncle   . Hypertension Mother   . Hyperlipidemia Mother   . Transient ischemic attack Mother   . Osteoarthritis Mother   . Hiatal hernia Mother   . Dementia  Mother   . Stroke Paternal Grandfather   . Transient ischemic attack Maternal Aunt   . Osteoarthritis Maternal Aunt   . Dementia Maternal Aunt   . ADD / ADHD Unknown   . Arthritis Unknown   . Cataracts Unknown   . Dementia Unknown   . Diabetes Unknown   . Hiatal hernia Unknown   . Tuberculosis Unknown   . Migraines Unknown   . Hiatal hernia Brother   . Osteoarthritis Paternal Aunt     Past Surgical History:  Procedure Laterality Date  . APPENDECTOMY    . BASAL CELL CARCINOMA EXCISION    . DENTAL SURGERY    . LAPAROSCOPY    . TONSILLECTOMY    . WISDOM TOOTH EXTRACTION      Social History   Socioeconomic History  . Marital status: Widowed    Spouse name: Not on file  . Number of children: Not on file  . Years of education: Not on file  . Highest education level: Not on file  Social Needs  . Financial resource strain: Not on file  . Food insecurity - worry: Not on file  . Food insecurity - inability: Not on file  . Transportation needs - medical: Not on file  . Transportation needs - non-medical: Not on file  Occupational History  . Not on file  Tobacco  Use  . Smoking status: Never Smoker  . Smokeless tobacco: Never Used  Substance and Sexual Activity  . Alcohol use: Yes    Alcohol/week: 2.4 oz    Types: 4 Standard drinks or equivalent per week  . Drug use: No  . Sexual activity: Not Currently    Birth control/protection: None  Other Topics Concern  . Not on file  Social History Narrative  . Not on file   Current Outpatient Medications on File Prior to Visit  Medication Sig Dispense Refill  . Ashwagandha 500 MG CAPS Take by mouth 2 (two) times daily.     Marland Kitchen aspirin 81 MG tablet Take 81 mg by mouth daily.    . Black Cohosh 540 MG CAPS Take by mouth.    . cyclobenzaprine (FLEXERIL) 10 MG tablet Take 10 mg by mouth daily as needed for muscle spasms.    Marland Kitchen DHEA 10 MG CAPS Take by mouth. 2 capsules sun-fri, one capsule on sat    . diclofenac sodium (VOLTAREN) 1  % GEL Apply topically 4 (four) times daily.    . fluticasone (FLONASE) 50 MCG/ACT nasal spray Place 2 sprays into both nostrils daily.  2  . Glucosamine Sulfate (GLUCOSAMINE RELIEF) 1000 MG TABS TAKE 1 TABLET BY MOUTH WITH A MEAL ONCE A DAY    . Iodine, Kelp, (KELP PO) Take 600 mg by mouth.    . loratadine (CLARITIN) 10 MG tablet Take 10 mg by mouth daily.    Marland Kitchen MAGNESIUM GLYCINATE PLUS PO Take by mouth.    . Melatonin 10 MG CAPS Take by mouth.    . metroNIDAZOLE (METROCREAM) 0.75 % cream Apply topically 2 (two) times daily.    . Omega-3 Fatty Acids (FISH OIL) 1000 MG CAPS Take by mouth.    . Pregnenolone Micronized POWD Take 30 mg by mouth.    . thyroid (ARMOUR) 120 MG tablet 130 mg. 1 tab daily mon-sat, 1/2 tab on sunday    . ZINC PICOLINATE PO Take by mouth.    . metFORMIN (GLUCOPHAGE-XR) 500 MG 24 hr tablet      No current facility-administered medications on file prior to visit.      Allergies  Allergen Reactions  . Pseudoephedrine Palpitations     Review of Systems ROS Review of Systems - General ROS: negative for - chills, fatigue, fever, hot flashes, night sweats, weight gain or weight loss Psychological ROS: negative for - anxiety, decreased libido, depression, mood swings, physical abuse or sexual abuse Ophthalmic ROS: negative for - blurry vision, eye pain or loss of vision ENT ROS: negative for - headaches, hearing change, visual changes or vocal changes Allergy and Immunology ROS: negative for - hives, itchy/watery eyes or seasonal allergies Hematological and Lymphatic ROS: negative for - bleeding problems, bruising, swollen lymph nodes or weight loss Endocrine ROS: negative for - galactorrhea, hair pattern changes, hot flashes, malaise/lethargy, mood swings, palpitations, polydipsia/polyuria, skin changes, temperature intolerance or unexpected weight changes Breast ROS: negative for - new or changing breast lumps or nipple discharge Respiratory ROS: negative for - cough  or shortness of breath Cardiovascular ROS: negative for - chest pain, irregular heartbeat, palpitations or shortness of breath Gastrointestinal ROS: no abdominal pain, change in bowel habits, or black or bloody stools Genito-Urinary ROS: no dysuria, trouble voiding, or hematuria.   Musculoskeletal ROS: negative for - joint pain or joint stiffness Neurological ROS: negative for - bowel and bladder control changes Dermatological ROS: negative for rash and skin lesion changes  Objective:   BP 113/70 (BP Location: Left Arm, Patient Position: Sitting, Cuff Size: Large)   Pulse 96   Ht 5' 6.5" (1.689 m)   Wt 199 lb 14.4 oz (90.7 kg)   LMP 11/21/2003   BMI 31.78 kg/m  CONSTITUTIONAL: Well-developed, well-nourished female in no acute distress. Obesity, mild.  PSYCHIATRIC: Normal mood and affect. Normal behavior. Normal judgment and thought content. [place]: Alert and oriented to person, place, and time. Normal muscle tone coordination. No cranial nerve deficit noted. HENT:  Normocephalic, atraumatic, External right and left ear normal. Oropharynx is clear and moist EYES: Conjunctivae and EOM are normal. Pupils are equal, round, and reactive to light. No scleral icterus.  NECK: Normal range of motion, supple, no masses.  Normal thyroid.  SKIN: Skin is warm and dry. No rash noted. Not diaphoretic. No erythema. No pallor. CARDIOVASCULAR: Normal heart rate noted, regular rhythm, no murmur. RESPIRATORY: Clear to auscultation bilaterally. Effort and breath sounds normal, no problems with respiration noted. BREASTS: Symmetric in size. No masses, skin changes, nipple drainage, or lymphadenopathy. ABDOMEN: Soft, normal bowel sounds, no distention noted.  No tenderness, rebound or guarding.  BLADDER: Normal PELVIC:  Bladder no bladder distension noted  Urethra: normal appearing urethra with no masses, tenderness or lesions  Vulva: normal appearing vulva with no masses, tenderness or  lesions  Vagina: normal appearing vagina with normal color and discharge, no lesions and mildly atrophic  Cervix: normal appearing cervix without discharge or lesions and cervical stenosis present  Uterus: uterus is normal size, shape, consistency and nontender  Adnexa: normal adnexa in size, nontender and no masses  RV: External Exam NormaI, No Rectal Masses and Normal Sphincter tone  MUSCULOSKELETAL: Normal range of motion. No tenderness.  No cyanosis, clubbing, or edema.  2+ distal pulses. LYMPHATIC: No Axillary, Supraclavicular, or Inguinal Adenopathy.     Lab Results  Component Value Date   WBC 5.6 05/16/2016   HGB 14.1 05/16/2016   HCT 41.7 05/16/2016   MCV 87.8 05/16/2016   PLT 331.0 05/16/2016      Chemistry      Component Value Date/Time   NA 137 03/25/2016 1344   K 4.0 03/25/2016 1344   CL 101 03/25/2016 1344   CO2 25 03/25/2016 1344   BUN 16 03/25/2016 1344   CREATININE 0.55 03/25/2016 1344   CREATININE 0.66 10/13/2015 1620      Component Value Date/Time   CALCIUM 9.5 03/25/2016 1344   ALKPHOS 68 03/25/2016 1344   AST 14 03/25/2016 1344   ALT 13 03/25/2016 1344   BILITOT 0.6 03/25/2016 1344      Lab Results  Component Value Date   TSH 0.08 (L) 03/25/2016    Lab Results  Component Value Date   CHOL 170 05/16/2016   HDL 63.20 05/16/2016   LDLCALC 91 05/16/2016   TRIG 78.0 05/16/2016   CHOLHDL 3 05/16/2016     Assessment:   Annual gynecologic examination 67 y.o. Postmenopausal, on HRT Vaginal atrophy Obesity 1 Hypothyroidism Pre-diabetes   Plan:  - Pap: Pap Co Test, up to date, however patient desires one to be performed today as she is turning 50 and is aware that after this age she will not require further screening if negative.   - Mammogram: Not Ordered. Patient will be moving soon, will establish care with a new GYN and have mammogram performed in new location.  - Stool Guaiac Testing:  Not Indicated.  Has had colonoscopy within the  past 10 years, however patient  thinks she may have had a polyp and might require sooner screening.  Will arrange with new PCP after her relocation. - Postmenopausal on HRT. Patient has been on HRT x ~11 years.  Discussed risks vs benefits of continued HRT.  Patient has been counseled each year on the risks/benefits of continued HRT.  Notes attempts in the past of weaning have been unsuccessful. Desires to continue use.  - Routine preventative health maintenance measures emphasized: Exercise/Diet/Weight control, Alcohol/Substance use risks, Stress Management and Safe Sex - Hypothyroidism and Pre-diabetes managed by PCP. Will need to establish with new PCP after relocation. - Can sign for medical record release once she has chosen her new providers.    Rubie Maid, MD  [facility]

## 2017-05-16 LAB — IGP, COBASHPV16/18
HPV 16: NEGATIVE
HPV 18: NEGATIVE
HPV OTHER HR TYPES: NEGATIVE
PAP SMEAR COMMENT: 0

## 2017-06-02 ENCOUNTER — Ambulatory Visit: Payer: No Typology Code available for payment source | Admitting: Family Medicine
